# Patient Record
Sex: Male | Born: 1980 | Hispanic: Yes | Marital: Single | Smoking: Never smoker
Health system: Southern US, Community
[De-identification: ages and names within clinical notes are randomized; demographics above are authoritative.]

## PROBLEM LIST (undated history)

## (undated) DIAGNOSIS — R569 Unspecified convulsions: Secondary | ICD-10-CM

## (undated) HISTORY — DX: Unspecified convulsions: R56.9

---

## 2016-11-05 DIAGNOSIS — L603 Nail dystrophy: Secondary | ICD-10-CM | POA: Diagnosis not present

## 2016-11-05 DIAGNOSIS — K429 Umbilical hernia without obstruction or gangrene: Secondary | ICD-10-CM | POA: Diagnosis not present

## 2016-12-14 ENCOUNTER — Emergency Department (HOSPITAL_COMMUNITY)
Admission: EM | Admit: 2016-12-14 | Discharge: 2016-12-14 | Disposition: A | Discharge status: Home / Self Care | Payer: BLUE CROSS/BLUE SHIELD | Attending: Emergency Medicine | Admitting: Emergency Medicine

## 2016-12-14 ENCOUNTER — Emergency Department (HOSPITAL_COMMUNITY): Payer: BLUE CROSS/BLUE SHIELD

## 2016-12-14 ENCOUNTER — Encounter (HOSPITAL_COMMUNITY): Payer: Self-pay

## 2016-12-14 DIAGNOSIS — W1830XA Fall on same level, unspecified, initial encounter: Secondary | ICD-10-CM | POA: Diagnosis not present

## 2016-12-14 DIAGNOSIS — S01512A Laceration without foreign body of oral cavity, initial encounter: Secondary | ICD-10-CM | POA: Diagnosis not present

## 2016-12-14 DIAGNOSIS — R569 Unspecified convulsions: Secondary | ICD-10-CM | POA: Diagnosis not present

## 2016-12-14 DIAGNOSIS — S0990XA Unspecified injury of head, initial encounter: Secondary | ICD-10-CM | POA: Diagnosis present

## 2016-12-14 DIAGNOSIS — Y990 Civilian activity done for income or pay: Secondary | ICD-10-CM | POA: Diagnosis not present

## 2016-12-14 DIAGNOSIS — G40909 Epilepsy, unspecified, not intractable, without status epilepticus: Secondary | ICD-10-CM | POA: Diagnosis not present

## 2016-12-14 DIAGNOSIS — S0003XA Contusion of scalp, initial encounter: Secondary | ICD-10-CM | POA: Diagnosis not present

## 2016-12-14 DIAGNOSIS — Y929 Unspecified place or not applicable: Secondary | ICD-10-CM | POA: Insufficient documentation

## 2016-12-14 DIAGNOSIS — Y939 Activity, unspecified: Secondary | ICD-10-CM | POA: Insufficient documentation

## 2016-12-14 LAB — CBC WITH DIFFERENTIAL/PLATELET
Basophils Absolute: 0 10*3/uL (ref 0.0–0.1)
Basophils Relative: 0 %
EOS PCT: 0 %
Eosinophils Absolute: 0 10*3/uL (ref 0.0–0.7)
HEMATOCRIT: 45.9 % (ref 39.0–52.0)
Hemoglobin: 16.2 g/dL (ref 13.0–17.0)
LYMPHS ABS: 1.5 10*3/uL (ref 0.7–4.0)
LYMPHS PCT: 10 %
MCH: 31.8 pg (ref 26.0–34.0)
MCHC: 35.3 g/dL (ref 30.0–36.0)
MCV: 90.2 fL (ref 78.0–100.0)
MONO ABS: 0.7 10*3/uL (ref 0.1–1.0)
MONOS PCT: 5 %
NEUTROS ABS: 12.7 10*3/uL — AB (ref 1.7–7.7)
Neutrophils Relative %: 85 %
PLATELETS: 183 10*3/uL (ref 150–400)
RBC: 5.09 MIL/uL (ref 4.22–5.81)
RDW: 12.5 % (ref 11.5–15.5)
WBC: 14.9 10*3/uL — ABNORMAL HIGH (ref 4.0–10.5)

## 2016-12-14 LAB — COMPREHENSIVE METABOLIC PANEL
ALBUMIN: 4.8 g/dL (ref 3.5–5.0)
ALT: 42 U/L (ref 17–63)
ANION GAP: 6 (ref 5–15)
AST: 41 U/L (ref 15–41)
Alkaline Phosphatase: 68 U/L (ref 38–126)
BILIRUBIN TOTAL: 0.9 mg/dL (ref 0.3–1.2)
BUN: 15 mg/dL (ref 6–20)
CHLORIDE: 107 mmol/L (ref 101–111)
CO2: 25 mmol/L (ref 22–32)
Calcium: 8.7 mg/dL — ABNORMAL LOW (ref 8.9–10.3)
Creatinine, Ser: 0.92 mg/dL (ref 0.61–1.24)
GFR calc Af Amer: >60 mL/min (ref >60)
GFR calc non Af Amer: >60 mL/min (ref >60)
GLUCOSE: 102 mg/dL — AB (ref 65–99)
POTASSIUM: 3.2 mmol/L — AB (ref 3.5–5.1)
SODIUM: 138 mmol/L (ref 135–145)
Total Protein: 7.6 g/dL (ref 6.5–8.1)

## 2016-12-14 LAB — MAGNESIUM: MAGNESIUM: 2.2 mg/dL (ref 1.7–2.4)

## 2016-12-14 LAB — RAPID URINE DRUG SCREEN, HOSP PERFORMED
AMPHETAMINES: NOT DETECTED
BARBITURATES: NOT DETECTED
BENZODIAZEPINES: NOT DETECTED
Cocaine: NOT DETECTED
Opiates: NOT DETECTED
Tetrahydrocannabinol: POSITIVE — AB

## 2016-12-14 LAB — CK: CK TOTAL: 2207 U/L — AB (ref 49–397)

## 2016-12-14 MED ORDER — SODIUM CHLORIDE 0.9 % IV BOLUS (SEPSIS)
1000.0000 mL | Freq: Once | INTRAVENOUS | Status: AC
  Administered 2016-12-14: 1000 mL via INTRAVENOUS

## 2016-12-14 MED ORDER — IBUPROFEN 200 MG PO TABS
600.0000 mg | ORAL_TABLET | Freq: Once | ORAL | Status: AC
  Administered 2016-12-14: 600 mg via ORAL
  Filled 2016-12-14: qty 3

## 2016-12-14 MED ORDER — LEVETIRACETAM 500 MG PO TABS: 500.0000 mg | ORAL_TABLET | Freq: Two times a day (BID) | ORAL | 0 refills | Status: DC

## 2016-12-14 MED ORDER — LEVETIRACETAM 500 MG/5ML IV SOLN
1000.0000 mg | Freq: Once | INTRAVENOUS | Status: AC
  Administered 2016-12-14: 1000 mg via INTRAVENOUS
  Filled 2016-12-14: qty 10

## 2016-12-14 MED ORDER — LIDOCAINE HCL 2 % IJ SOLN
20.0000 mL | Freq: Once | INTRAMUSCULAR | Status: AC
  Administered 2016-12-14: 400 mg via INTRADERMAL
  Filled 2016-12-14: qty 20
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
## 2016-12-14 NOTE — ED Notes (Signed)
RN DRAWING LABS 

## 2016-12-14 NOTE — Discharge Instructions (Signed)
Take Keppra twice a day Please follow up with neurology

## 2016-12-19 ENCOUNTER — Ambulatory Visit (INDEPENDENT_AMBULATORY_CARE_PROVIDER_SITE_OTHER): Payer: BLUE CROSS/BLUE SHIELD | Admitting: Neurology

## 2016-12-19 ENCOUNTER — Encounter: Payer: Self-pay | Admitting: Neurology

## 2016-12-19 VITALS — BP 127/75 | HR 84 | Ht 69.0 in | Wt 189.0 lb

## 2016-12-19 DIAGNOSIS — G40009 Localization-related (focal) (partial) idiopathic epilepsy and epileptic syndromes with seizures of localized onset, not intractable, without status epilepticus: Secondary | ICD-10-CM | POA: Diagnosis not present

## 2016-12-19 MED ORDER — LEVETIRACETAM 500 MG PO TABS
500.0000 mg | ORAL_TABLET | Freq: Two times a day (BID) | ORAL | 11 refills | Status: DC
Start: 2016-12-19 — End: 2017-02-28
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
## 2016-12-19 NOTE — Patient Instructions (Signed)
I had a long discussion with the patient and his wife regarding his 2 episodes of brief altered consciousness with weakness seizure activity likely representing complex partial seizures. I agree with continuing Keppra 500 twice daily. Check MRI scan of the brain with and without contrast as well as EEG. I counseled the patient to avoid sleep deprivation and stimulants like alcohol and street drugs. He was advised not to drive for 6 months as per [facility]. He was also advised to stay away from working on heights or climbing ladders. He will return for follow-up in 2 months or call earlier if necessary  Epilepsy Epilepsy is when a person keeps having seizures. A seizure is unusual activity in the brain. A seizure can change how you think or behave, and it can make it hard to be aware of what is happening. This condition can cause problems, such as:  Falls, accidents, and injury.  Depression.  Poor memory.  Sudden unexplained death in epilepsy (SUDEP). This is rare. Its cause is not known. Most people with epilepsy lead normal lives. Follow these instructions at home: Medicines    Take medicines only as told by your doctor.  Avoid anything that may keep your medicine from working, such as alcohol. Activity   Get enough rest. Lack of sleep can make seizures more likely to occur.  Follow your doctor's advice about driving, swimming, and doing anything else that would be dangerous if you had a seizure. Teaching others  Teach friends and family what to do if you have a seizure. They should:  Lay you on the ground to prevent a fall.  Cushion your head and body.  Loosen any tight clothing around your neck.  Turn you on your side.  Stay with you until you are better.  Not hold you down.  Not put anything in your mouth.  Know whether or not you need emergency care. General instructions   Avoid anything that causes you to have seizures.  Keep a seizure diary. Write down  what you remember about each seizure, and especially what might have caused it.  Keep all follow-up visits as told by your doctor. This is important. Contact a doctor if:  You have a change in your seizure pattern.  You get an infection or start to feel sick. You may have more seizures when you are sick. Get help right away if:  A seizure does not stop after 5 minutes.  You have more than one seizure in a row, and you do not have enough time between the seizures to feel better.  A seizure makes it harder to breathe.  A seizure is different from other seizures you have had.  A seizure makes you unable to speak or use a part of your body.  You did not wake up right after a seizure. This information is not intended to replace advice given to you by your health care provider. Make sure you discuss any questions you have with your health care provider. Document Released: 06/10/2009 Document Revised: 03/19/2016 Document Reviewed: 02/21/2016 Elsevier Interactive Patient Education  2017 [employer]. .

## 2016-12-26 ENCOUNTER — Other Ambulatory Visit: Payer: BLUE CROSS/BLUE SHIELD

## 2016-12-26 ENCOUNTER — Ambulatory Visit (INDEPENDENT_AMBULATORY_CARE_PROVIDER_SITE_OTHER): Payer: BLUE CROSS/BLUE SHIELD

## 2016-12-26 DIAGNOSIS — G40009 Localization-related (focal) (partial) idiopathic epilepsy and epileptic syndromes with seizures of localized onset, not intractable, without status epilepticus: Secondary | ICD-10-CM | POA: Diagnosis not present

## 2016-12-26 MED ORDER — GADOPENTETATE DIMEGLUMINE 469.01 MG/ML IV SOLN: 19.0000 mL | Freq: Once | INTRAVENOUS | Status: DC | PRN

## 2016-12-31 ENCOUNTER — Other Ambulatory Visit: Payer: BLUE CROSS/BLUE SHIELD

## 2017-01-01 ENCOUNTER — Telehealth: Payer: Self-pay

## 2017-01-01 ENCOUNTER — Ambulatory Visit (INDEPENDENT_AMBULATORY_CARE_PROVIDER_SITE_OTHER): Payer: BLUE CROSS/BLUE SHIELD | Admitting: Neurology

## 2017-01-01 DIAGNOSIS — G40009 Localization-related (focal) (partial) idiopathic epilepsy and epileptic syndromes with seizures of localized onset, not intractable, without status epilepticus: Secondary | ICD-10-CM | POA: Diagnosis not present

## 2017-01-01 NOTE — Telephone Encounter (Signed)
I called and left a message on patient's answering machine about MRI scan of the brain showing no worrisome findings. She was advised to call back or keep her follow-up appointment with me

## 2017-01-01 NOTE — Telephone Encounter (Signed)
Receive a message from front desk Marisue IvanLiz that pt wanted results of MRI. Pt was here for EEG. Rn stated MRI results are in. Dr. Pearlean BrownieSethi will review MRI and give results if abnormal. IF normal the nurse will call.

## 2017-01-08 NOTE — Telephone Encounter (Signed)
Rn call patient that his EEG was normal. Pt verbalized understanding. 

## 2017-01-08 NOTE — Telephone Encounter (Signed)
Rn call patient that the MRI scan of brain showed no significant abnormalities. PT verbalized understanding.

## 2017-02-04 DIAGNOSIS — Z0000 Encounter for general adult medical examination without abnormal findings: Secondary | ICD-10-CM | POA: Diagnosis not present

## 2017-02-04 DIAGNOSIS — Z23 Encounter for immunization: Secondary | ICD-10-CM | POA: Diagnosis not present

## 2017-02-28 ENCOUNTER — Encounter: Payer: Self-pay | Admitting: Neurology

## 2017-02-28 ENCOUNTER — Ambulatory Visit (INDEPENDENT_AMBULATORY_CARE_PROVIDER_SITE_OTHER): Payer: BLUE CROSS/BLUE SHIELD | Admitting: Neurology

## 2017-02-28 ENCOUNTER — Encounter (INDEPENDENT_AMBULATORY_CARE_PROVIDER_SITE_OTHER): Payer: Self-pay

## 2017-02-28 VITALS — BP 120/75 | HR 74 | Ht 69.0 in | Wt 189.6 lb

## 2017-02-28 DIAGNOSIS — G40909 Epilepsy, unspecified, not intractable, without status epilepticus: Secondary | ICD-10-CM | POA: Diagnosis not present

## 2017-02-28 MED ORDER — LEVETIRACETAM 500 MG PO TABS: 500.0000 mg | ORAL_TABLET | Freq: Two times a day (BID) | ORAL | 11 refills | Status: DC
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
## 2017-02-28 NOTE — Patient Instructions (Signed)
I had a long discussion with the patient and his lady friend regarding his seizure, need for compliance with Keppra and need to avoid seizure provoking stimuli and discuss results of MRI scan and EEG and answered questions. Continue Keppra 500 mg twice daily and he was given a refill. The patient may start driving next month and she'll be seizure free more than 4 months that time. He will return for follow-up in 6 months with minerals practitioner call earlier if necessary

## 2017-05-02 ENCOUNTER — Ambulatory Visit: Payer: BLUE CROSS/BLUE SHIELD | Admitting: Family Medicine

## 2017-05-03 ENCOUNTER — Encounter: Payer: Self-pay | Admitting: Family Medicine

## 2017-05-03 ENCOUNTER — Ambulatory Visit (INDEPENDENT_AMBULATORY_CARE_PROVIDER_SITE_OTHER): Payer: BLUE CROSS/BLUE SHIELD | Admitting: Family Medicine

## 2017-05-03 DIAGNOSIS — M25561 Pain in right knee: Secondary | ICD-10-CM | POA: Diagnosis not present

## 2017-05-03 MED ORDER — DICLOFENAC SODIUM 75 MG PO TBEC: 75.0000 mg | DELAYED_RELEASE_TABLET | Freq: Two times a day (BID) | ORAL | 1 refills | Status: DC

## 2017-05-03 NOTE — Patient Instructions (Signed)
You have synovitis of your knee due to overuse, likely early arthritis. Your ligament, meniscus testing is normal. Take diclofenac  twice a day with food for pain and inflammation - take regularly for 10 days then as needed. Icing 15 minutes at a time 3-4 times a day at least. Compression sleeve or ACE wrap to help with swelling. Elevate above your heart as much as possible. Straight leg raises, knee extensions 3 sets of 10 once a day. I'd expect this to improve over 2-3 weeks. If you're struggling and want to try aspiration/injection or just injection give me a call. Otherwise follow up with me as needed.

## 2017-05-06 ENCOUNTER — Ambulatory Visit: Payer: BLUE CROSS/BLUE SHIELD | Admitting: Family Medicine

## 2017-05-06 DIAGNOSIS — M25561 Pain in right knee: Secondary | ICD-10-CM | POA: Insufficient documentation

## 2017-05-06 NOTE — Progress Notes (Signed)
PCP: [facility]  Subjective:   HPI: Patient is a 36 y.o. male here for right knee pain.  Patient reports about 1 week ago he started to get pain and swelling in right knee. 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 He reports falling onto this knee last summer when playing soccer and it swelled up on him. Has been icing at night but not taking any medicines for this. Has medial pain that feels like a nail digging into the area at times 3/10 level. No catching, locking, giving out. No skin changes, numbness.  Past Medical History:  Diagnosis Date  . Seizures (HCC)     Current Outpatient Prescriptions on File Prior to Visit  Medication Sig Dispense Refill  . Biotin 1 MG CAPS Take 1,000 mg by mouth daily.    Marland Kitchen. levETIRAcetam (KEPPRA) 500 MG tablet Take 1 tablet (500 mg total) by mouth 2 (two) times daily. 60 tablet 11  . Melatonin 5 MG TABS Take 5 mg by mouth at bedtime.     Current Facility-Administered Medications on File Prior to Visit  Medication Dose Route Frequency Provider Last Rate Last Dose  . gadopentetate dimeglumine (MAGNEVIST) injection 19 mL  19 mL Intravenous Once PRN Micki RileySethi, Pramod S, MD        No past surgical history on file.  No Known Allergies  Social History   Social History  . Marital status: Single    Spouse name: N/A  . Number of children: N/A  . Years of education: N/A   Occupational History  . Not on file.   Social History Main Topics  . Smoking status: Never Smoker  . Smokeless tobacco: Never Used  . Alcohol use Yes     Comment: social  . Drug use: No  . Sexual activity: Not on file   Other Topics Concern  . Not on file   Social History Narrative  . No narrative on file    No family history on file.  BP 131/80   Pulse 74   Ht 5\' 8"  (1.727 m)   Wt 185 lb (83.9 kg)   BMI 28.13 kg/m   Review of Systems: See HPI above.      Objective:  Physical Exam:  Gen: NAD, comfortable in exam room  Right knee: Mod effusion.  No bruising, other deformity. Minimal tenderness medial joint line.  No other tenderness. FROM. Negative ant/post drawers. Negative valgus/varus testing. Negative lachmanns. Negative mcmurrays, apleys, patellar apprehension. NV intact distally.  Left knee: No gross deformity, ecchymoses, swelling. No TTP. FROM. Negative ant/post drawers. Negative valgus/varus testing. NV intact distally.   Assessment & Plan:  1. Right knee pain and effusion - confirmed effusion and synovitis on today's MSK u/s.  His exam is otherwise reassuring with negative ligamentous and meniscal testing.  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  Start with diclofenac twice a day, icing, compression, elevation.  Shown home exercises to do daily.  Consider aspiration/injection if not improving as expected over next 2-3 weeks.  F/u prn if doing well.

## 2017-05-06 NOTE — Assessment & Plan Note (Signed)
confirmed effusion and synovitis on today's MSK u/s.  His exam is otherwise reassuring with negative ligamentous and meniscal testing.  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  Start with diclofenac twice a day, icing, compression, elevation.  Shown home exercises to do daily.  Consider aspiration/injection if not improving as expected over next 2-3 weeks.  F/u prn if doing well.
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
## 2017-09-02 ENCOUNTER — Encounter: Payer: Self-pay | Admitting: Nurse Practitioner

## 2017-09-02 ENCOUNTER — Encounter (INDEPENDENT_AMBULATORY_CARE_PROVIDER_SITE_OTHER): Payer: Self-pay

## 2017-09-02 ENCOUNTER — Ambulatory Visit: Payer: BLUE CROSS/BLUE SHIELD | Admitting: Nurse Practitioner

## 2017-09-02 DIAGNOSIS — G40909 Epilepsy, unspecified, not intractable, without status epilepticus: Secondary | ICD-10-CM | POA: Diagnosis not present

## 2017-09-02 MED ORDER — LEVETIRACETAM 500 MG PO TABS: 500.0000 mg | ORAL_TABLET | Freq: Two times a day (BID) | ORAL | 11 refills | Status: DC

## 2017-09-02 NOTE — Patient Instructions (Signed)
Restart Keppra 500mg  twice daily Call for any seizure activity Follpw up 6 months Please remember, common seizure triggers are: Sleep deprivation, dehydration, overheating, stress, hypoglycemia or skipping meals, certain medications or excessive alcohol use, especially stopping alcohol abruptly if you have had heavy alcohol use before (aka alcohol withdrawal seizure). If you have a prolonged seizure over 2-5 minutes or back to back seizures, call or have someone call 911 or take you to the nearest emergency room. You cannot drive a car or operate any other machinery or vehicle within 6 months of a seizure. Please do not swim alone  for safety.  Take your medicine for seizure prevention regularly and do not skip doses or stop medication abruptly and tone are told to do so by your healthcare provider. Try to get a refill on your antiepileptic medication ahead of time, so you are not at risk of running out. If you run out of the seizure medication and do not have a refill at hand she may run into medication withdrawal seizures. Avoid taking Wellbutrin, narcotic pain medications and tramadol, as they can lower seizure threshold.

## 2017-09-02 NOTE — Progress Notes (Signed)
Keppra Rx successfully faxed to [facility], [address]

## 2018-03-06 IMAGING — CT CT HEAD W/O CM
3 of 4 series · 15 of 47 positions shown, 18 images · non-contrast
Comparison: None.

CLINICAL DATA: Possible seizure today.

EXAM:
CT HEAD WITHOUT CONTRAST
TECHNIQUE: Contiguous axial images were obtained from the base of the skull
through the vertex without intravenous contrast.

[Series 2: head w/o · axial · non-contrast · 0.40mm/px · z∈[-151,-26]mm · 9 of 31 slices shown, 12 images]
[im 3/31  brain]
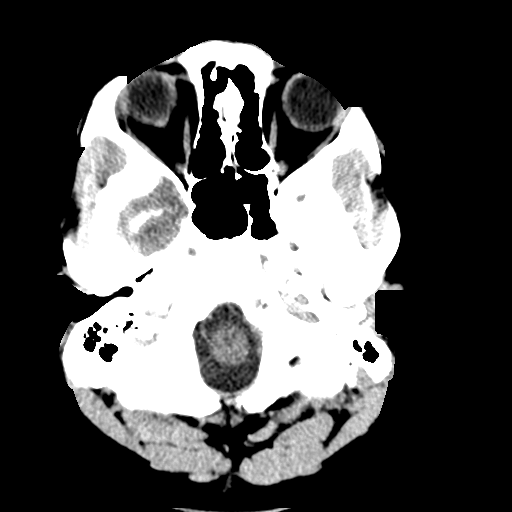
[im 3/31  bone]
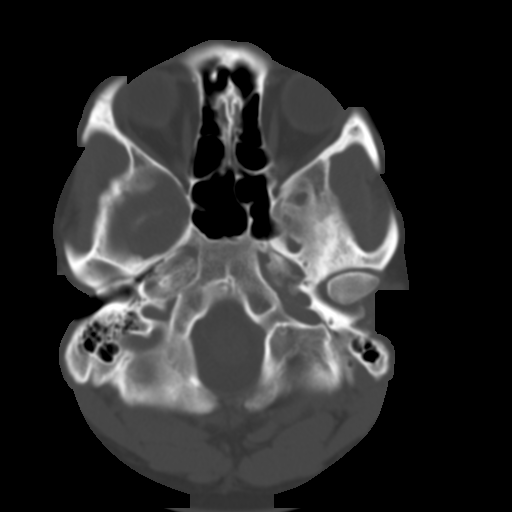
[im 7/31  brain]
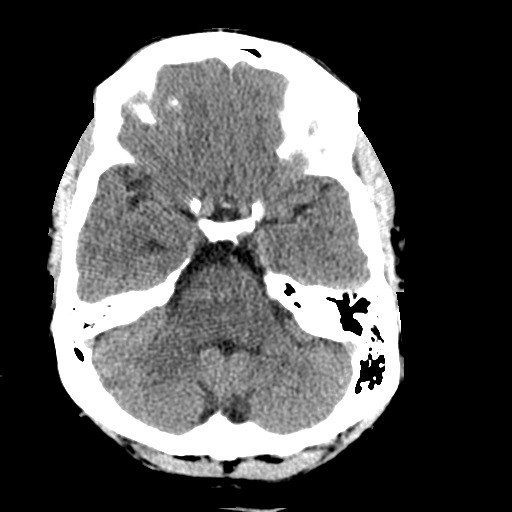
[im 9/31  brain]
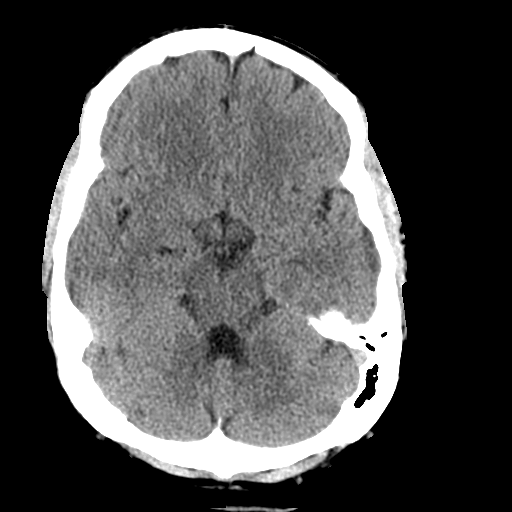
[im 13/31  brain]
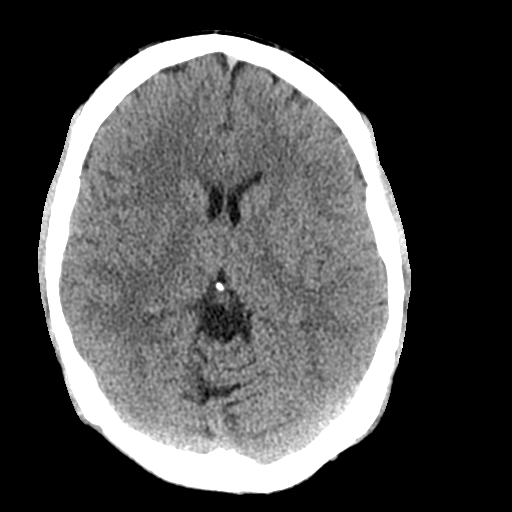
[im 16/31  brain]
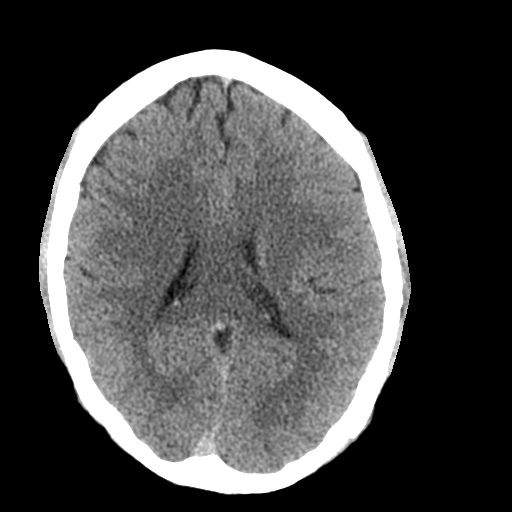
[im 16/31  bone]
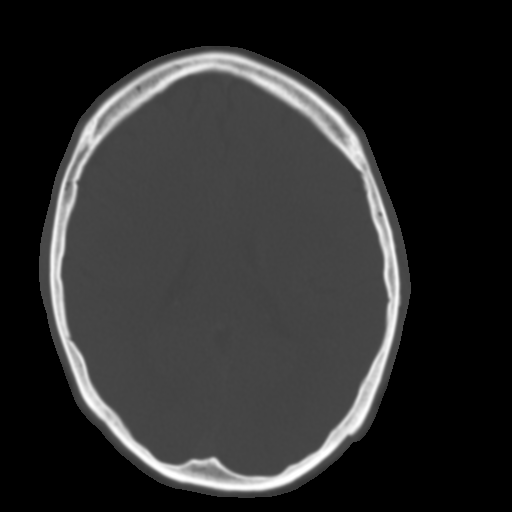
[im 18/31  brain]
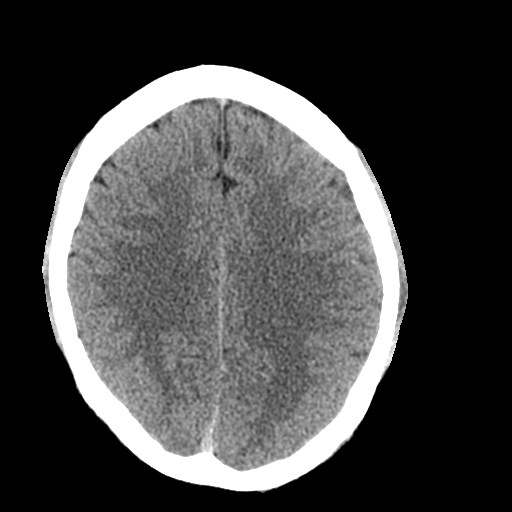
[im 22/31  brain]
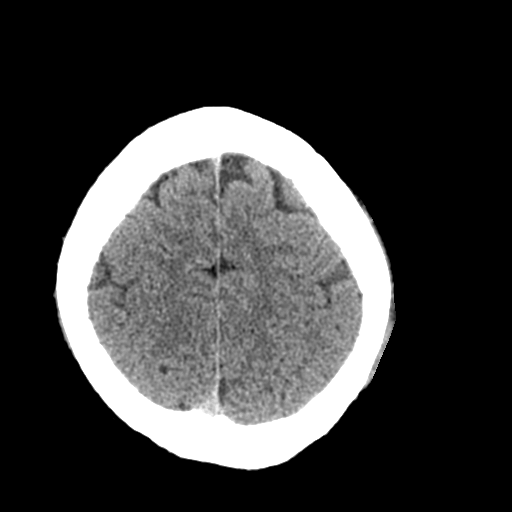
[im 24/31  brain]
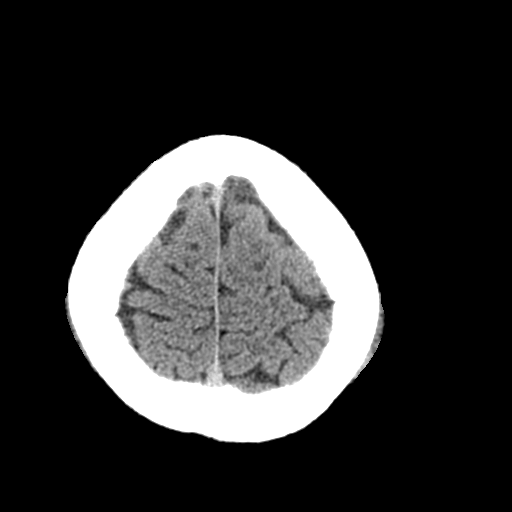
[im 28/31  brain]
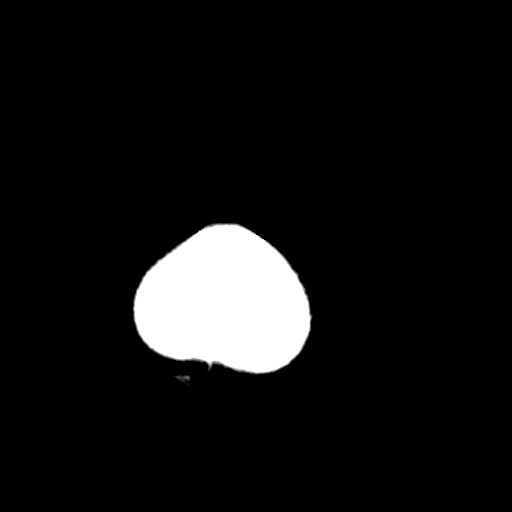
[im 28/31  bone]
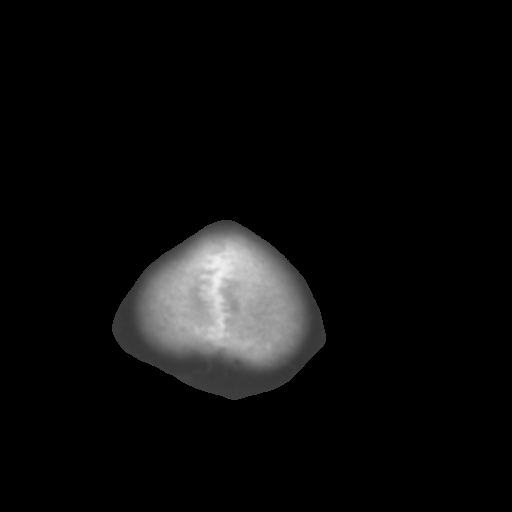

[Series 5: coronal · coronal · 0.29mm/px · 3 of 67 slices shown]
[im 23/67  brain]
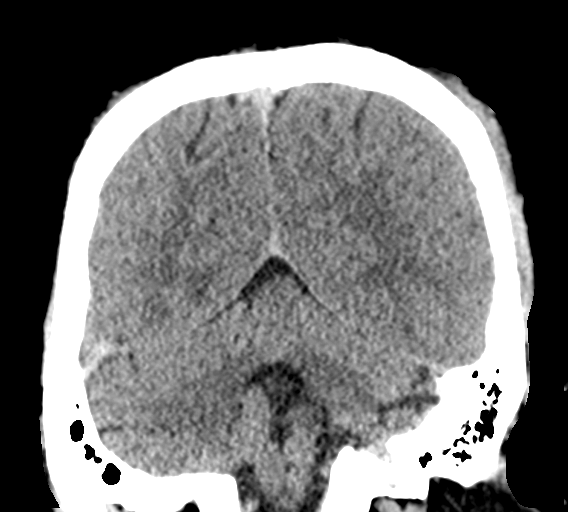
[im 30/67  brain]
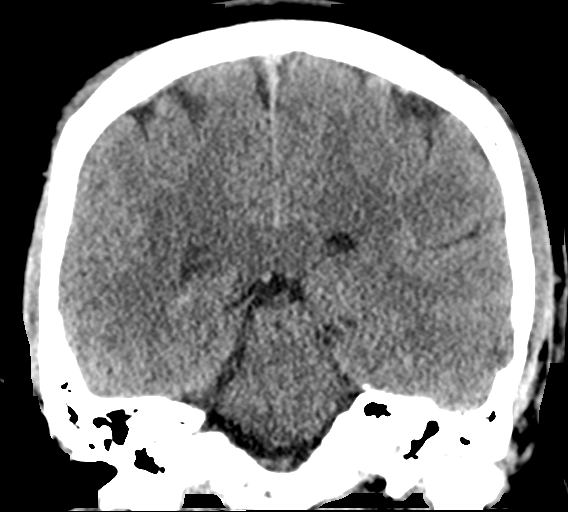
[im 37/67  brain]
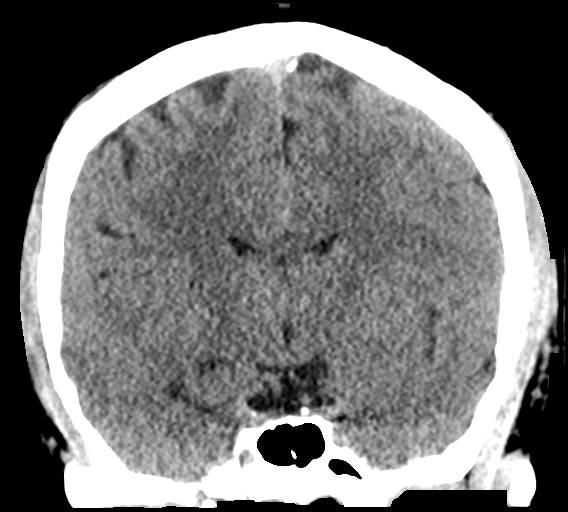

[Series 6: sagittal · sagittal · 0.29mm/px · 3 of 54 slices shown]
[im 18/54  brain]
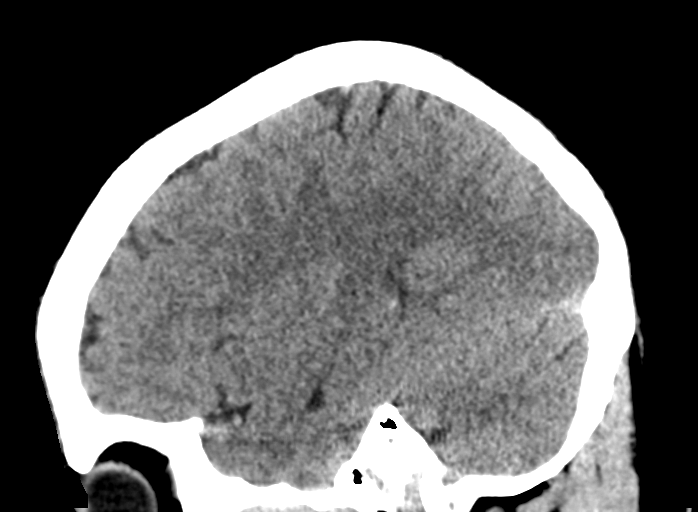
[im 27/54  brain]
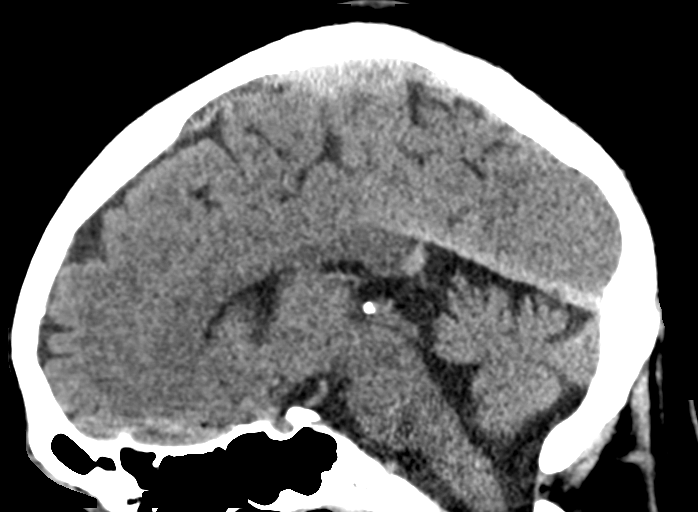
[im 36/54  brain]
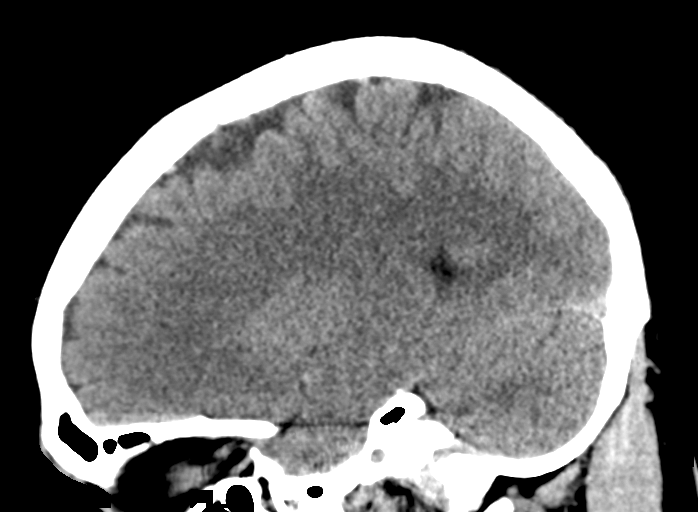

[15 of 47 positions shown; findings below may reference images not displayed]

FINDINGS: Brain: Appears normal without hemorrhage, infarct, mass lesion, mass
effect, midline shift or abnormal extra-axial fluid collection. No
hydrocephalus or pneumocephalus.

Vascular: Negative.

Skull: Intact.

Sinuses/Orbits: Negative.

Other: Scalp contusion near the vertex posteriorly on the left is
noted.
IMPRESSION: Scalp contusion near the vertex on the left without underlying
fracture. The brain appears normal.

## 2018-10-10 ENCOUNTER — Telehealth: Payer: Self-pay | Admitting: Nurse Practitioner

## 2018-10-10 MED ORDER — LEVETIRACETAM 500 MG PO TABS: 500.0000 mg | ORAL_TABLET | Freq: Two times a day (BID) | ORAL | 0 refills | Status: DC

## 2018-10-10 NOTE — Addendum Note (Signed)
Addended by: Colen Darling C on: 10/10/2018 11:01 AM   Modules accepted: Orders

## 2018-10-10 NOTE — Telephone Encounter (Signed)
Pt has scheduled annual f/u he is asking for a refill on his levETIRAcetam (KEPPRA) 500 MG tablet [employer] [phone]

## 2018-10-10 NOTE — Telephone Encounter (Signed)
Levetiracetam refilled x 1 month. Patient has FU on 10/15/18, will get additional refills after FU.

## 2018-10-15 ENCOUNTER — Ambulatory Visit: Payer: BLUE CROSS/BLUE SHIELD | Admitting: Adult Health

## 2018-10-15 ENCOUNTER — Encounter: Payer: Self-pay | Admitting: Adult Health

## 2018-10-15 VITALS — BP 112/71 | HR 76 | Ht 69.0 in | Wt 196.0 lb

## 2018-10-15 DIAGNOSIS — G40909 Epilepsy, unspecified, not intractable, without status epilepticus: Secondary | ICD-10-CM

## 2018-10-15 MED ORDER — LEVETIRACETAM 500 MG PO TABS: 500.0000 mg | ORAL_TABLET | Freq: Two times a day (BID) | ORAL | 3 refills | Status: DC

## 2018-10-15 NOTE — Progress Notes (Signed)
[facility]  PATIENT: Tanner [name] DOB: Aug 04, 1981   REASON FOR VISIT: Follow-up for seizure disorder HISTORY FROM: Patient  HPI:  Tanner [name] is a 38 y.o. male who is here today for follow-up of seizures and medication management.  Initial seizure activity in 2018 and was placed on Keppra at that time.  MRI head on 12/2016 showed tiny nonspecific white matter hyperintensities but otherwise no other structural lesion.  EEG also performed in 12/2016 which was normal.  He is being seen today for one-year follow-up.  He continues on Keppra without reported side effects.  He denies any recurrent seizure activity.  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  He returns today for evaluation.     REVIEW OF SYSTEMS: Full 14 system review of systems performed and notable only for those listed, all others are neg:  Constitutional: neg  Cardiovascular: neg Ear/Nose/Throat: neg  Skin: neg Eyes: neg Respiratory: neg Gastroitestinal: neg  Hematology/Lymphatic: neg  Endocrine: neg Musculoskeletal:neg Allergy/Immunology: neg Neurological: neg Psychiatric: neg Sleep : neg   ALLERGIES: No Known Allergies  HOME MEDICATIONS: Outpatient Medications Prior to Visit  Medication Sig Dispense Refill  . levETIRAcetam (KEPPRA) 500 MG tablet Take 1 tablet (500 mg total) by mouth 2 (two) times daily. 60 tablet 0  . diphenhydramine-acetaminophen (TYLENOL PM) 25-500 MG TABS tablet Take by mouth.     Facility-Administered Medications Prior to Visit  Medication Dose Route Frequency Provider Last Rate Last Dose  . gadopentetate dimeglumine (MAGNEVIST) injection 19 mL  19 mL Intravenous Once PRN Micki Riley, MD        PAST MEDICAL HISTORY: Past Medical History:  Diagnosis Date  . Seizures (HCC)     PAST SURGICAL HISTORY: No past surgical history on file.  FAMILY HISTORY: No family history on file.  SOCIAL  HISTORY: Social History   Socioeconomic History  . Marital status: Single    Spouse name: Not on file  . Number of children: Not on file  . Years of education: Not on file  . Highest education level: Not on file  Occupational History  . Not on file  Social Needs  . Financial resource strain: Not on file  . Food insecurity:    Worry: Not on file    Inability: Not on file  . Transportation needs:    Medical: Not on file    Non-medical: Not on file  Tobacco Use  . Smoking status: Never Smoker  . Smokeless tobacco: Never Used  Substance and Sexual Activity  . Alcohol use: Yes    Comment: social  . Drug use: No  . Sexual activity: Not on file  Lifestyle  . Physical activity:    Days per week: Not on file    Minutes per session: Not on file  . Stress: Not on file  Relationships  . Social connections:    Talks on phone: Not on file    Gets together: Not on file    Attends religious service: Not on file    Active member of club or organization: Not on file    Attends meetings of clubs or organizations: Not on file    Relationship status: Not on file  . Intimate partner violence:    Fear of current or ex partner: Not on file    Emotionally abused: Not on file    Physically abused: Not on file    Forced sexual activity: Not on file  Other  Topics Concern  . Not on file  Social History Narrative  . Not on file     PHYSICAL EXAM  Vitals:   10/15/18 1346  BP: 112/71  Pulse: 76  Weight: 196 lb (88.9 kg)  Height: 5\' 9"  (1.753 m)   Body mass index is 28.94 kg/m.  Generalized: Well developed, pleasant middle-aged Hispanic male, in no acute distress  Head: normocephalic and atraumatic,.  Musculoskeletal: No deformity   Neurological examination   Mentation: Alert oriented to time, place, history taking. Attention span and concentration appropriate. Recent and remote memory intact.  Follows all commands speech and language fluent.   Cranial nerve II-XII: Pupils were  equal round reactive to light extraocular movements were full, visual field were full on confrontational test. Facial sensation and strength were normal. hearing was intact to finger rubbing bilaterally. Uvula tongue midline. head turning and shoulder shrug were normal and symmetric.Tongue protrusion into cheek strength was normal. Motor: normal bulk and tone, full strength in the BUE, BLE, fine finger movements normal, no pronator drift. No focal weakness Sensory: normal and symmetric to light touch,  Coordination: finger-nose-finger, heel-to-shin bilaterally, no dysmetria Reflexes: Brachioradialis 2/2, biceps 2/2, triceps 2/2, patellar 2/2, Achilles 2/2, plantar responses were flexor bilaterally. Gait and Station: Rising up from seated position without assistance, normal stance,  moderate stride, good arm swing, smooth turning, able to perform tiptoe, and heel walking without difficulty. Tandem gait is steady  DIAGNOSTIC DATA (LABS, IMAGING, TESTING) - I reviewed patient records, labs, notes, testing and imaging myself where available.    ASSESSMENT AND PLAN    Tanner [name] is a 38 y.o. Hispanic male with history of seizure activity in 2018 and has been stable since that time with continuation of Keppra.   Continuation of Keppra 500 mg twice daily -refill sent to his pharmacy Continue to stay active and ensure compliance of ADD  Follow-up in 1 year or call earlier if needed  Greater than 50% of time during this 15 minute visit was spent on counseling,explanation of diagnosis of seizures, planning of further management, discussion with patient regarding continuation of AED and importance of ongoing compliance   George Hugh, AGNP-BC  [facility] [address] [place], [place] [address]  Phone [phone] Fax [phone] Note: This document was prepared with digital dictation and possible smart phrase technology. Any transcriptional  errors that result from this process are unintentional.

## 2018-10-15 NOTE — Patient Instructions (Addendum)
Your Plan:  Continue Keppra 500 mg twice daily for seizure prevention  Follow-up in 1 year or call earlier if needed with any seizure activity, questions, concerns or need a sooner follow-up appointment     Thank you for coming to see [place] at [facility]. I hope we have been able to provide you high quality care today.  You may receive a patient satisfaction survey over the next few weeks. We would appreciate your feedback and comments so that we may continue to improve ourselves and the health of our patients.

## 2018-10-17 NOTE — Progress Notes (Signed)
I agree with the above plan 

## 2019-10-07 ENCOUNTER — Telehealth: Payer: Self-pay | Admitting: Adult Health

## 2019-10-07 NOTE — Telephone Encounter (Signed)
I called patient and LVM to reschedule 2/24 appointment due to NP out of office. Requested patient call back to reschedule. 

## 2019-10-21 ENCOUNTER — Ambulatory Visit: Payer: Self-pay | Admitting: Adult Health

## 2019-11-02 ENCOUNTER — Ambulatory Visit: Payer: Self-pay | Admitting: Adult Health

## 2019-11-02 NOTE — Progress Notes (Deleted)
[facility]  PATIENT: Tanner [name] DOB: 1981-02-02   REASON FOR VISIT: Follow-up for seizure disorder HISTORY FROM: Patient  Chief complaint: No chief complaint on file.     HPI:  Tanner [name] is a 39 y.o. male who is here today, 11/02/2019, for follow-up of seizures and medication management.  Initial seizure activity in 2018 and was placed on Keppra at that time.  MRI head on 12/2016 showed tiny nonspecific white matter hyperintensities but otherwise no other structural lesion.  EEG also performed in 12/2016 which was normal.  He continues on Keppra 500 mg twice daily without reported side effects.  He denies any recurrent seizure activity.  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  No concerns at this time.     REVIEW OF SYSTEMS: Full 14 system review of systems performed and notable only for those listed, all others are neg:  Constitutional: neg  Cardiovascular: neg Ear/Nose/Throat: neg  Skin: neg Eyes: neg Respiratory: neg Gastroitestinal: neg  Hematology/Lymphatic: neg  Endocrine: neg Musculoskeletal:neg Allergy/Immunology: neg Neurological: neg Psychiatric: neg Sleep : neg   ALLERGIES: No Known Allergies  HOME MEDICATIONS: Outpatient Medications Prior to Visit  Medication Sig Dispense Refill  . levETIRAcetam (KEPPRA) 500 MG tablet Take 1 tablet (500 mg total) by mouth 2 (two) times daily. 120 tablet 3   Facility-Administered Medications Prior to Visit  Medication Dose Route Frequency Provider Last Rate Last Admin  . gadopentetate dimeglumine (MAGNEVIST) injection 19 mL  19 mL Intravenous Once PRN Garvin Fila, MD        PAST MEDICAL HISTORY: Past Medical History:  Diagnosis Date  . Seizures ([place])     PAST SURGICAL HISTORY: No past surgical history on file.  FAMILY HISTORY: No family history on file.  SOCIAL HISTORY: Social History   Socioeconomic History  .  Marital status: Single    Spouse name: Not on file  . Number of children: Not on file  . Years of education: Not on file  . Highest education level: Not on file  Occupational History  . Not on file  Tobacco Use  . Smoking status: Never Smoker  . Smokeless tobacco: Never Used  Substance and Sexual Activity  . Alcohol use: Yes    Comment: social  . Drug use: No  . Sexual activity: Not on file  Other Topics Concern  . Not on file  Social History Narrative  . Not on file   Social Determinants of Health   Financial Resource Strain:   . Difficulty of Paying Living Expenses: Not on file  Food Insecurity:   . Worried About Charity fundraiser in the Last Year: Not on file  . Ran Out of Food in the Last Year: Not on file  Transportation Needs:   . Lack of Transportation (Medical): Not on file  . Lack of Transportation (Non-Medical): Not on file  Physical Activity:   . Days of Exercise per Week: Not on file  . Minutes of Exercise per Session: Not on file  Stress:   . Feeling of Stress : Not on file  Social Connections:   . Frequency of Communication with Friends and Family: Not on file  . Frequency of Social Gatherings with Friends and Family: Not on file  . Attends Religious Services: Not on file  . Active Member of Clubs or Organizations: Not on file  . Attends Archivist Meetings: Not on file  . Marital Status:  Not on file  Intimate Partner Violence:   . Fear of Current or Ex-Partner: Not on file  . Emotionally Abused: Not on file  . Physically Abused: Not on file  . Sexually Abused: Not on file     PHYSICAL EXAM  There were no vitals filed for this visit. There is no height or weight on file to calculate BMI.  Generalized: Well developed, pleasant middle-aged Hispanic male, in no acute distress  Head: normocephalic and atraumatic,.  Musculoskeletal: No deformity   Neurological examination   Mentation: Alert oriented to time, place, history taking.  Attention span and concentration appropriate. Recent and remote memory intact.  Follows all commands speech and language fluent.   Cranial nerve II-XII: Pupils were equal round reactive to light extraocular movements were full, visual field were full on confrontational test. Facial sensation and strength were normal. hearing was intact to finger rubbing bilaterally. Uvula tongue midline. head turning and shoulder shrug were normal and symmetric.Tongue protrusion into cheek strength was normal. Motor: normal bulk and tone, full strength in the BUE, BLE, fine finger movements normal, no pronator drift. No focal weakness Sensory: normal and symmetric to light touch,  Coordination: finger-nose-finger, heel-to-shin bilaterally, no dysmetria Reflexes: Brachioradialis 2/2, biceps 2/2, triceps 2/2, patellar 2/2, Achilles 2/2, plantar responses were flexor bilaterally. Gait and Station: Rising up from seated position without assistance, normal stance,  moderate stride, good arm swing, smooth turning, able to perform tiptoe, and heel walking without difficulty. Tandem gait is steady  DIAGNOSTIC DATA (LABS, IMAGING, TESTING) - I reviewed patient records, labs, notes, testing and imaging myself where available.    ASSESSMENT AND PLAN    Tanner [name] is a 39 y.o. Hispanic male with history of seizure activity in 2018 and has been stable since that time with continuation of Keppra.   Continuation of Keppra 500 mg twice daily -refill sent to his pharmacy Continue to stay active and ensure compliance of ADD  Follow-up in 1 year or call earlier if needed  Greater than 50% of time during this 15 minute visit was spent on counseling,explanation of diagnosis of seizures, planning of further management, discussion with patient regarding continuation of AED and importance of ongoing compliance   Tanner [name], AGNP-BC  [facility] [address] [place], [place]  [address]  Phone [phone] Fax [phone] Note: This document was prepared with digital dictation and possible smart phrase technology. Any transcriptional errors that result from this process are unintentional.

## 2019-11-12 NOTE — Progress Notes (Signed)
[facility]  PATIENT: Tanner [name] DOB: 04-18-1981   REASON FOR VISIT: Follow-up for seizure disorder HISTORY FROM: Patient  Chief complaint: Chief Complaint  Patient presents with  . Follow-up    [place] Rm. With daughter. doing wwell, would like to discuss lower dosing of current medication.      HPI:  Tanner [name] is a 39 y.o. male who is here today, 11/16/2019, for follow-up of seizures and medication management.  Initial seizure activity in 2018 and was placed on Keppra at that time.  MRI head on 12/2016 showed tiny nonspecific white matter hyperintensities but otherwise no other structural lesion or abnormality.  EEG in 12/2016 normal.  He continues on Keppra 500 mg twice daily without reported side effects.  He denies any recurrent seizure activity.  He does question possibility of lowering dosage as he has been stable without reoccurring seizures.  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  No concerns at this time.     REVIEW OF SYSTEMS: Full 14 system review of systems performed and notable only for those listed, all others are neg:  Constitutional: neg  Cardiovascular: neg Ear/Nose/Throat: neg  Skin: neg Eyes: neg Respiratory: neg Gastroitestinal: neg  Hematology/Lymphatic: neg  Endocrine: neg Musculoskeletal:neg Allergy/Immunology: neg Neurological: neg Psychiatric: neg Sleep : neg   ALLERGIES: No Known Allergies  HOME MEDICATIONS: Outpatient Medications Prior to Visit  Medication Sig Dispense Refill  . levETIRAcetam (KEPPRA) 500 MG tablet Take 1 tablet (500 mg total) by mouth 2 (two) times daily. 120 tablet 3   Facility-Administered Medications Prior to Visit  Medication Dose Route Frequency Provider Last Rate Last Admin  . gadopentetate dimeglumine (MAGNEVIST) injection 19 mL  19 mL Intravenous Once PRN Micki Riley, MD        PAST MEDICAL HISTORY: Past Medical History:  Diagnosis Date  .  Seizures (HCC)     PAST SURGICAL HISTORY: No past surgical history on file.  FAMILY HISTORY: No family history on file.  SOCIAL HISTORY: Social History   Socioeconomic History  . Marital status: Single    Spouse name: Not on file  . Number of children: Not on file  . Years of education: Not on file  . Highest education level: Not on file  Occupational History  . Not on file  Tobacco Use  . Smoking status: Never Smoker  . Smokeless tobacco: Never Used  Substance and Sexual Activity  . Alcohol use: Yes    Comment: social  . Drug use: No  . Sexual activity: Not on file  Other Topics Concern  . Not on file  Social History Narrative  . Not on file   Social Determinants of Health   Financial Resource Strain:   . Difficulty of Paying Living Expenses:   Food Insecurity:   . Worried About Programme researcher, broadcasting/film/video in the Last Year:   . Barista in the Last Year:   Transportation Needs:   . Freight forwarder (Medical):   Marland Kitchen Lack of Transportation (Non-Medical):   Physical Activity:   . Days of Exercise per Week:   . Minutes of Exercise per Session:   Stress:   . Feeling of Stress :   Social Connections:   . Frequency of Communication with Friends and Family:   . Frequency of Social Gatherings with Friends and Family:   . Attends Religious Services:   . Active Member of Clubs or Organizations:   . Attends Club  or Organization Meetings:   Marland Kitchen Marital Status:   Intimate Partner Violence:   . Fear of Current or Ex-Partner:   . Emotionally Abused:   Marland Kitchen Physically Abused:   . Sexually Abused:      PHYSICAL EXAM  Vitals:   11/16/19 0720  BP: 140/86  Pulse: 72  Temp: 97.7 F (36.5 C)  Weight: 197 lb (89.4 kg)  Height: 5\' 9"  (1.753 m)   Body mass index is 29.09 kg/m.  General: well developed, well nourished,  pleasant middle-age male, seated, in no evident distress Head: head normocephalic and atraumatic.   Neck: supple with no carotid or supraclavicular  bruits Cardiovascular: regular rate and rhythm, no murmurs Musculoskeletal: no deformity Skin:  no rash/petichiae Vascular:  Normal pulses all extremities   Neurologic Exam Mental Status: Awake and fully alert.   Normal speech and language.  Oriented to place and time. Recent and remote memory intact. Attention span, concentration and fund of knowledge appropriate. Mood and affect appropriate.  Cranial Nerves: Pupils equal, briskly reactive to light. Extraocular movements full without nystagmus. Visual fields full to confrontation. Hearing intact. Facial sensation intact. Face, tongue, palate moves normally and symmetrically.  Motor: Normal bulk and tone. Normal strength in all tested extremity muscles. Sensory.: intact to touch , pinprick , position and vibratory sensation.  Coordination: Rapid alternating movements normal in all extremities. Finger-to-nose and heel-to-shin performed accurately bilaterally. Gait and Station: Arises from chair without difficulty. Stance is normal. Gait demonstrates normal stride length and balance Reflexes: 1+ and symmetric. Toes downgoing.       ASSESSMENT AND PLAN    Tanner [name] is a 39 y.o. Hispanic male with history of seizure activity in 2018 consisting of loss of consciousness without reoccurring seizure activity with ongoing use of Keppra 500 mg twice daily.   Continuation of Keppra 500 mg twice daily -refill sent to his pharmacy.  Discussion regarding potentially lowering dosage to 250 mg twice daily or 500 mg XR daily but due to seizure type, he would have to refrain from driving for 6 months due to potential of recurrent seizure with decreasing dosage.  He prefers at this time to continue current dosage. Advised to call office with any seizure activity or concerns  Follow-up in 1 year or call earlier if needed  I spent 21 minutes of face-to-face and non-face-to-face time with patient.  This included previsit chart review, lab review,  study review, order entry, electronic health record documentation, patient education regarding ongoing use of AED for seizure prophylaxis    Frann Rider, AGNP-BC  [facility] [address] [place], [place] [address]  Phone [phone] Fax [phone] Note: This document was prepared with digital dictation and possible smart phrase technology. Any transcriptional errors that result from this process are unintentional.

## 2019-11-16 ENCOUNTER — Ambulatory Visit: Payer: Managed Care, Other (non HMO) | Admitting: Adult Health

## 2019-11-16 ENCOUNTER — Other Ambulatory Visit: Payer: Self-pay

## 2019-11-16 ENCOUNTER — Encounter: Payer: Self-pay | Admitting: Adult Health

## 2019-11-16 VITALS — BP 140/86 | HR 72 | Temp 97.7°F | Ht 69.0 in | Wt 197.0 lb

## 2019-11-16 DIAGNOSIS — G40909 Epilepsy, unspecified, not intractable, without status epilepticus: Secondary | ICD-10-CM

## 2019-11-16 MED ORDER — LEVETIRACETAM 500 MG PO TABS: 500.0000 mg | ORAL_TABLET | Freq: Two times a day (BID) | ORAL | 3 refills | Status: DC

## 2019-11-16 NOTE — Patient Instructions (Signed)
Your Plan:  Continue keppra 500mg  twice daily - refill placed        Thank you for coming to see at [facility]. I hope we have been able to provide you high quality care today.  You may receive a patient satisfaction survey over the next few weeks. We would appreciate your feedback and comments so that we may continue to improve ourselves and the health of our patients.

## 2019-11-16 NOTE — Progress Notes (Signed)
I have read the note, and I agree with the clinical assessment and plan.  Naydene Kamrowski A. Din Bookwalter, MD, PhD, FAAN Certified in Neurology, Clinical Neurophysiology, Sleep Medicine, Pain Medicine and Neuroimaging  [facility] [address], [address] [place], [place] [address] [phone]  

## 2020-11-21 ENCOUNTER — Encounter: Payer: Self-pay | Admitting: Adult Health

## 2020-11-21 ENCOUNTER — Ambulatory Visit: Payer: Managed Care, Other (non HMO) | Admitting: Adult Health

## 2020-11-21 VITALS — BP 134/80 | HR 71 | Ht 69.0 in | Wt 205.0 lb

## 2020-11-21 DIAGNOSIS — G40909 Epilepsy, unspecified, not intractable, without status epilepticus: Secondary | ICD-10-CM | POA: Diagnosis not present

## 2020-11-21 MED ORDER — LEVETIRACETAM 500 MG PO TABS: 500.0000 mg | ORAL_TABLET | Freq: Two times a day (BID) | ORAL | 3 refills | Status: DC

## 2020-11-21 NOTE — Progress Notes (Signed)
I agree with the above plan 

## 2020-11-21 NOTE — Progress Notes (Signed)
[facility]  PATIENT: Tanner [name] DOB: 08/25/1981   REASON FOR VISIT: Follow-up for seizure disorder HISTORY FROM: Patient  Chief complaint: Chief Complaint  Patient presents with  . Follow-up    RM 14 with daughter  Pt is well, no seizures, no complaints       HPI:  Tanner [name] is a 40 y.o. male with initial seizure activity in 2018 and was placed on Keppra at that time.  MRI head on 12/2016 showed tiny nonspecific white matter hyperintensities but otherwise no other structural lesion or abnormality.  EEG in 12/2016 normal.     Today, 11/21/2020, returns for yearly seizure follow-up accompanied by his daughter Compliant on Keppra 500 mg twice daily tolerating without side effects No reoccurring seizure activity He continues to work full-time, drives and maintains ADLs and IADLs independently No concerns at this time.       REVIEW OF SYSTEMS: Full 14 system review of systems performed and notable only for those listed, all others are neg:  Constitutional: neg  Cardiovascular: neg Ear/Nose/Throat: neg  Skin: neg Eyes: neg Respiratory: neg Gastroitestinal: neg  Hematology/Lymphatic: neg  Endocrine: neg Musculoskeletal:neg Allergy/Immunology: neg Neurological: neg Psychiatric: neg Sleep : neg   ALLERGIES: No Known Allergies  HOME MEDICATIONS: Outpatient Medications Prior to Visit  Medication Sig Dispense Refill  . levETIRAcetam (KEPPRA) 500 MG tablet Take 1 tablet (500 mg total) by mouth 2 (two) times daily. 180 tablet 3  . gadopentetate dimeglumine (MAGNEVIST) injection 19 mL      No facility-administered medications prior to visit.    PAST MEDICAL HISTORY: Past Medical History:  Diagnosis Date  . Seizures (HCC)     PAST SURGICAL HISTORY: History reviewed. No pertinent surgical history.  FAMILY HISTORY: History reviewed. No pertinent family history.  SOCIAL HISTORY: Social History   Socioeconomic History  .  Marital status: Single    Spouse name: Not on file  . Number of children: Not on file  . Years of education: Not on file  . Highest education level: Not on file  Occupational History  . Not on file  Tobacco Use  . Smoking status: Never Smoker  . Smokeless tobacco: Never Used  Substance and Sexual Activity  . Alcohol use: Yes    Comment: social  . Drug use: No  . Sexual activity: Not on file  Other Topics Concern  . Not on file  Social History Narrative  . Not on file   Social Determinants of Health   Financial Resource Strain: Not on file  Food Insecurity: Not on file  Transportation Needs: Not on file  Physical Activity: Not on file  Stress: Not on file  Social Connections: Not on file  Intimate Partner Violence: Not on file     PHYSICAL EXAM  Vitals:   11/21/20 0729  BP: 134/80  Pulse: 71  Weight: 205 lb (93 kg)  Height: 5\' 9"  (1.753 m)   Body mass index is 30.27 kg/m.  General: well developed, well nourished,  pleasant middle-age male, seated, in no evident distress Head: head normocephalic and atraumatic.   Neck: supple with no carotid or supraclavicular bruits Cardiovascular: regular rate and rhythm, no murmurs Musculoskeletal: no deformity Skin:  no rash/petichiae Vascular:  Normal pulses all extremities   Neurologic Exam Mental Status: Awake and fully alert.   Normal speech and language.  Oriented to place and time. Recent and remote memory intact. Attention span, concentration and fund of knowledge appropriate. Mood and affect appropriate.  Cranial  Nerves: Pupils equal, briskly reactive to light. Extraocular movements full without nystagmus. Visual fields full to confrontation. Hearing intact. Facial sensation intact. Face, tongue, palate moves normally and symmetrically.  Motor: Normal bulk and tone. Normal strength in all tested extremity muscles. Sensory.: intact to touch , pinprick , position and vibratory sensation.  Coordination: Rapid  alternating movements normal in all extremities. Finger-to-nose and heel-to-shin performed accurately bilaterally. Gait and Station: Arises from chair without difficulty. Stance is normal. Gait demonstrates normal stride length and balance Reflexes: 1+ and symmetric. Toes downgoing.       ASSESSMENT AND PLAN    Tanner [name] is a 40 y.o. Hispanic male with history of seizure activity in 2018 consisting of loss of consciousness without reoccurring seizure activity with ongoing use of Keppra 500 mg twice daily.   Seizure disorder Continue Keppra 500 mg twice daily -1 year refill provided Recent lab work obtained by PCP satisfactory Discussed avoidance of seizure provoking triggers or activities Advised to call office with any seizure activity or concerns   Follow-up in 1 year or call earlier if needed   CC:  [facility] provider: Dr. Cristal Ford, Casimiro Needle, MD    I spent 20 minutes of face-to-face and non-face-to-face time with patient.  This included previsit chart review, lab review, study review, order entry, electronic health record documentation, patient education and discussion regarding history of seizures and ongoing use of AED and answered all other questions to patient satisfaction   Ihor Austin, [facility]  [facility] [address] [place], [place] [address]  Phone [phone] Fax [phone] Note: This document was prepared with digital dictation and possible smart phrase technology. Any transcriptional errors that result from this process are unintentional.

## 2022-02-12 NOTE — Progress Notes (Unsigned)
[facility]  PATIENT: Tanner Tanner [name] DOB: 1980/12/05   REASON FOR VISIT: Follow-up for seizure disorder HISTORY FROM: Patient  Chief complaint: No chief complaint on file.     HPI:  Tanner Tanner [name] a 41 y.o. male with initial seizure activity in 2018 and was placed on Keppra at that time.  MRI head on 12/2016 showed tiny nonspecific white matter hyperintensities but otherwise no other structural lesion or abnormality.  EEG in 12/2016 normal.    Update 02/13/2022 JM: Patient returns for yearly seizure follow-up.  Previously seen 11/21/2020 and doing well at that time.  He continues to do well without any seizure activity.  Compliant on Keppra, denies side effects.  Continues to work full-time, drives and maintains ADLs and IADLs independently.  No new concerns at this time.       REVIEW OF SYSTEMS: Full 14 system review of systems performed and notable only for those listed, all others are neg:  Constitutional: neg  Cardiovascular: neg Ear/Nose/Throat: neg  Skin: neg Eyes: neg Respiratory: neg Gastroitestinal: neg  Hematology/Lymphatic: neg  Endocrine: neg Musculoskeletal:neg Allergy/Immunology: neg Neurological: neg Psychiatric: neg Sleep : neg   ALLERGIES: No Known Allergies  HOME MEDICATIONS: Outpatient Medications Prior to Visit  Medication Sig Dispense Refill   levETIRAcetam (KEPPRA) 500 MG tablet Take 1 tablet (500 mg total) by mouth 2 (two) times daily. 180 tablet 3   No facility-administered medications prior to visit.    PAST MEDICAL HISTORY: Past Medical History:  Diagnosis Date   Seizures (HCC)     PAST SURGICAL HISTORY: No past surgical history on file.  FAMILY HISTORY: No family history on file.  SOCIAL HISTORY: Social History   Socioeconomic History   Marital status: Single    Spouse name: Not on file   Number of children: Not on file   Years of education: Not on file   Highest education level: Not on file   Occupational History   Not on file  Tobacco Use   Smoking status: Never   Smokeless tobacco: Never  Substance and Sexual Activity   Alcohol use: Yes    Comment: social   Drug use: No   Sexual activity: Not on file  Other Topics Concern   Not on file  Social History Narrative   Not on file   Social Determinants of Health   Financial Resource Strain: Not on file  Food Insecurity: Not on file  Transportation Needs: Not on file  Physical Activity: Not on file  Stress: Not on file  Social Connections: Not on file  Intimate Partner Violence: Not on file     PHYSICAL EXAM  There were no vitals filed for this visit.  There Tanner [name] no height or weight on file to calculate BMI.  General: well developed, well nourished,  pleasant middle-age male, seated, in no evident distress Head: head normocephalic and atraumatic.   Neck: supple with no carotid or supraclavicular bruits Cardiovascular: regular rate and rhythm, no murmurs Musculoskeletal: no deformity Skin:  no rash/petichiae Vascular:  Normal pulses all extremities   Neurologic Exam Mental Status: Awake and fully alert.   Normal speech and language.  Oriented to place and time. Recent and remote memory intact. Attention span, concentration and fund of knowledge appropriate. Mood and affect appropriate.  Cranial Nerves: Pupils equal, briskly reactive to light. Extraocular movements full without nystagmus. Visual fields full to confrontation. Hearing intact. Facial sensation intact. Face, tongue, palate moves normally and symmetrically.  Motor: Normal bulk and tone.  Normal strength in all tested extremity muscles. Sensory.: intact to touch , pinprick , position and vibratory sensation.  Coordination: Rapid alternating movements normal in all extremities. Finger-to-nose and heel-to-shin performed accurately bilaterally. Gait and Station: Arises from chair without difficulty. Stance Tanner [name] normal. Gait demonstrates normal stride length  and balance Reflexes: 1+ and symmetric. Toes downgoing.       ASSESSMENT AND PLAN    Tanner Tanner [name] Tanner [name] a 41 y.o. Hispanic male with history of seizure activity in 2018 consisting of loss of consciousness without reoccurring seizure activity with ongoing use of Keppra 500 mg twice daily.     Seizure disorder Continue Keppra 500 mg twice daily -1 year refill provided Recent lab work obtained by PCP satisfactory Discussed avoidance of seizure provoking triggers or activities Advised to call office with any seizure activity or concerns    Follow-up in 1 year or call earlier if needed    CC:  Loyal Jacobson, MD    I spent 20 minutes of face-to-face and non-face-to-face time with patient.  This included previsit chart review, lab review, study review, order entry, electronic health record documentation, patient education and discussion regarding history of seizures and ongoing use of AED and answered all other questions to patient satisfaction   Ihor Austin, [facility]  [facility] [address] [place], [place] [address]  Phone [phone] Fax [phone] Note: This document was prepared with digital dictation and possible smart phrase technology. Any transcriptional errors that result from this process are unintentional.

## 2022-02-13 ENCOUNTER — Encounter: Payer: Self-pay | Admitting: Adult Health

## 2022-02-13 ENCOUNTER — Ambulatory Visit: Payer: Managed Care, Other (non HMO) | Admitting: Adult Health

## 2022-02-13 VITALS — BP 139/81 | HR 62 | Ht 68.0 in | Wt 198.0 lb

## 2022-02-13 DIAGNOSIS — G40909 Epilepsy, unspecified, not intractable, without status epilepticus: Secondary | ICD-10-CM | POA: Diagnosis not present

## 2022-02-13 MED ORDER — LEVETIRACETAM 500 MG PO TABS: 500.0000 mg | ORAL_TABLET | Freq: Two times a day (BID) | ORAL | 3 refills | Status: DC

## 2022-02-13 NOTE — Patient Instructions (Signed)
No changes today - continue current plan  Follow up in 1 year or call earlier if needed 

## 2022-05-29 ENCOUNTER — Encounter: Payer: Self-pay | Admitting: Adult Health

## 2022-07-06 ENCOUNTER — Encounter: Payer: Self-pay | Admitting: Adult Health

## 2022-07-10 ENCOUNTER — Other Ambulatory Visit: Payer: Self-pay | Admitting: Neurology

## 2022-07-10 MED ORDER — LEVETIRACETAM ER 500 MG PO TB24: 1000.0000 mg | ORAL_TABLET | Freq: Every day | ORAL | 5 refills | Status: DC

## 2022-07-10 MED ORDER — LEVETIRACETAM ER 1000 MG PO TB24: 1000.0000 mg | ORAL_TABLET | Freq: Every day | ORAL | 5 refills | Status: DC

## 2022-07-10 NOTE — Telephone Encounter (Signed)
What's your optinion? Do you think keppra is causing the emotional affects he is having ?

## 2022-07-10 NOTE — Telephone Encounter (Signed)
These symptoms could be from keppra. Will recommend transitioning to XR formulation as this could help with these potential side effects. Will place new order for Keppra XR 1000mg  nightly.

## 2022-12-24 ENCOUNTER — Other Ambulatory Visit: Payer: Self-pay | Admitting: Adult Health

## 2023-02-18 ENCOUNTER — Ambulatory Visit: Payer: Managed Care, Other (non HMO) | Admitting: Adult Health

## 2023-02-21 ENCOUNTER — Other Ambulatory Visit: Payer: Self-pay | Admitting: Adult Health

## 2023-03-03 NOTE — Progress Notes (Unsigned)
[facility]  PATIENT: Tanner [name] DOB: [birth year]   REASON FOR VISIT: Follow-up for seizure disorder HISTORY FROM: Patient  Chief complaint: No chief complaint on file.     HPI:  Tanner [name] is a 42 y.o. male with initial seizure activity in 2018 and was placed on Keppra at that time.  MRI head on 12/2016 showed tiny nonspecific white matter hyperintensities but otherwise no other structural lesion or abnormality.  EEG in 12/2016 normal.    Update 03/03/2023 JM: returns for yearly follow up visit. Previously seen 02/13/2022 and doing well at that time. In 06/2022, pt c/o potential depression/mood change side effects from keppra IR 500mg  BID therefore switched to keppra XR 1000mg  daily, has been doing since that time. Compliant on keppra dosage. Denies any seizure activity.  Continues to work full time, drives and maintains ADLs and IADLs independently No new neurological concerns at this time.   Update 02/13/2022 JM: Patient returns for yearly seizure follow-up unaccompanied.  Previously seen 11/21/2020 and doing well at that time.  He continues to do well without any seizure activity.  Compliant on Keppra, denies side effects.  Continues to work full-time, drives and maintains ADLs and IADLs independently.  No new neurological concerns at this time.       REVIEW OF SYSTEMS: Full 14 system review of systems performed and notable only for those listed, all others are neg:  Constitutional: neg  Cardiovascular: neg Ear/Nose/Throat: neg  Skin: neg Eyes: neg Respiratory: neg Gastroitestinal: neg  Hematology/Lymphatic: neg  Endocrine: neg Musculoskeletal:neg Allergy/Immunology: neg Neurological: neg Psychiatric: neg Sleep : neg   ALLERGIES: No Known Allergies  HOME MEDICATIONS: Outpatient Medications Prior to Visit  Medication Sig Dispense Refill   levETIRAcetam (KEPPRA XR) 500 MG 24 hr tablet TAKE 2 TABLETS BY MOUTH AT BEDTIME 60 tablet 0   No  facility-administered medications prior to visit.    PAST MEDICAL HISTORY: Past Medical History:  Diagnosis Date   Seizures (HCC)     PAST SURGICAL HISTORY: No past surgical history on file.  FAMILY HISTORY: No family history on file.  SOCIAL HISTORY: Social History   Socioeconomic History   Marital status: Single    Spouse name: Not on file   Number of children: Not on file   Years of education: Not on file   Highest education level: Not on file  Occupational History   Not on file  Tobacco Use   Smoking status: Never   Smokeless tobacco: Never  Substance and Sexual Activity   Alcohol use: Yes    Comment: social   Drug use: No   Sexual activity: Not on file  Other Topics Concern   Not on file  Social History Narrative   Not on file   Social Determinants of Health   Financial Resource Strain: Not on file  Food Insecurity: Not on file  Transportation Needs: Not on file  Physical Activity: Not on file  Stress: Not on file  Social Connections: Not on file  Intimate Partner Violence: Not on file     PHYSICAL EXAM  There were no vitals filed for this visit.  There is no height or weight on file to calculate BMI.   General: well developed, well nourished,  pleasant middle-age male, seated, in no evident distress Head: head normocephalic and atraumatic.   Neck: supple with no carotid or supraclavicular bruits Cardiovascular: regular rate and rhythm, no murmurs Musculoskeletal: no deformity Skin:  no rash/petichiae Vascular:  Normal pulses all extremities  Neurologic Exam Mental Status: Awake and fully alert.   Normal speech and language.  Oriented to place and time. Recent and remote memory intact. Attention span, concentration and fund of knowledge appropriate. Mood and affect appropriate.  Cranial Nerves: Pupils equal, briskly reactive to light. Extraocular movements full without nystagmus. Visual fields full to confrontation. Hearing intact. Facial  sensation intact. Face, tongue, palate moves normally and symmetrically.  Motor: Normal bulk and tone. Normal strength in all tested extremity muscles. Sensory.: intact to touch , pinprick , position and vibratory sensation.  Coordination: Rapid alternating movements normal in all extremities. Finger-to-nose and heel-to-shin performed accurately bilaterally. Gait and Station: Arises from chair without difficulty. Stance is normal. Gait demonstrates normal stride length and balance without use of assistive device.  Tandem walk and heel toe without difficulty. Reflexes: 1+ and symmetric. Toes downgoing.       ASSESSMENT AND PLAN    Tanner [name] is a 42 y.o. Hispanic male with history of seizure activity in 2018 consisting of loss of consciousness without reoccurring seizure activity with ongoing use of Keppra.      Seizure disorder Continue Keppra XR 1000mg  nightly -1 year refill provided CMP and CBC routinely monitored by PCP - has been satisfactory per patient (unable to view via epic) Discussed avoidance of seizure provoking triggers or activities Advised to call office with any seizure activity or concerns    Follow-up in 1 year or call earlier if needed    CC:  Loyal Jacobson, MD    I spent 21 minutes of face-to-face and non-face-to-face time with patient.  This included previsit chart review, lab review, study review, order entry, electronic health record documentation, patient education and discussion regarding history of seizures and ongoing use of AED and answered all other questions to patient satisfaction   Ihor Austin, [facility]  [facility] [address] [place], [place] [address]  Phone [phone] Fax [phone] Note: This document was prepared with digital dictation and possible smart phrase technology. Any transcriptional errors that result from this process are unintentional.

## 2023-03-04 ENCOUNTER — Encounter: Payer: Self-pay | Admitting: Adult Health

## 2023-03-04 ENCOUNTER — Ambulatory Visit (INDEPENDENT_AMBULATORY_CARE_PROVIDER_SITE_OTHER): Payer: No Typology Code available for payment source | Admitting: Adult Health

## 2023-03-04 VITALS — BP 138/86 | Ht 69.0 in | Wt 214.0 lb

## 2023-03-04 DIAGNOSIS — Z5181 Encounter for therapeutic drug level monitoring: Secondary | ICD-10-CM

## 2023-03-04 DIAGNOSIS — G40909 Epilepsy, unspecified, not intractable, without status epilepticus: Secondary | ICD-10-CM | POA: Diagnosis not present

## 2023-03-04 NOTE — Patient Instructions (Addendum)
Continue keppra XR 1000mg  nightly for seizure prevention  Will check lab work today - if satisfactory, will plan on transitioning to lamotrigine. I will send you a message in MyChart regarding gradual increase of lamotrigine dosage and once at full dose, will gradually taper off keppra  Please call with any seizure activity or medication concerns    Follow-up in 1 year or call earlier if needed

## 2023-03-05 ENCOUNTER — Other Ambulatory Visit: Payer: Self-pay | Admitting: Adult Health

## 2023-03-05 ENCOUNTER — Encounter: Payer: Self-pay | Admitting: Adult Health

## 2023-03-05 LAB — CBC
Hematocrit: 46.9 % (ref 37.5–51.0)
Hemoglobin: 15.6 g/dL (ref 13.0–17.7)
MCH: 30.3 pg (ref 26.6–33.0)
MCHC: 33.3 g/dL (ref 31.5–35.7)
MCV: 91 fL (ref 79–97)
Platelets: 183 10*3/uL (ref 150–450)
RBC: 5.15 x10E6/uL (ref 4.14–5.80)
RDW: 12.6 % (ref 11.6–15.4)
WBC: 5.6 10*3/uL (ref 3.4–10.8)

## 2023-03-05 LAB — COMPREHENSIVE METABOLIC PANEL
ALT: 31 IU/L (ref 0–44)
AST: 16 IU/L (ref 0–40)
Albumin: 4.5 g/dL (ref 4.1–5.1)
Alkaline Phosphatase: 107 IU/L (ref 44–121)
BUN/Creatinine Ratio: 11 (ref 9–20)
BUN: 11 mg/dL (ref 6–24)
Bilirubin Total: 0.3 mg/dL (ref 0.0–1.2)
CO2: 25 mmol/L (ref 20–29)
Calcium: 9.5 mg/dL (ref 8.7–10.2)
Chloride: 104 mmol/L (ref 96–106)
Creatinine, Ser: 0.96 mg/dL (ref 0.76–1.27)
Globulin, Total: 2.7 g/dL (ref 1.5–4.5)
Glucose: 139 mg/dL — ABNORMAL HIGH (ref 70–99)
Potassium: 4.3 mmol/L (ref 3.5–5.2)
Sodium: 141 mmol/L (ref 134–144)
Total Protein: 7.2 g/dL (ref 6.0–8.5)
eGFR: 101 mL/min/{1.73_m2} (ref >59)

## 2023-03-05 MED ORDER — LAMOTRIGINE 25 MG PO TABS: ORAL_TABLET | ORAL | 3 refills | Status: DC

## 2023-03-27 ENCOUNTER — Other Ambulatory Visit: Payer: Self-pay | Admitting: Adult Health

## 2023-05-14 ENCOUNTER — Encounter: Payer: Self-pay | Admitting: Adult Health

## 2023-05-14 ENCOUNTER — Other Ambulatory Visit: Payer: Self-pay | Admitting: Neurology

## 2023-05-14 MED ORDER — LAMOTRIGINE 100 MG PO TABS: 100.0000 mg | ORAL_TABLET | Freq: Two times a day (BID) | ORAL | 1 refills | Status: DC

## 2023-11-04 ENCOUNTER — Other Ambulatory Visit: Payer: Self-pay | Admitting: Adult Health

## 2023-11-04 NOTE — Telephone Encounter (Signed)
 Last seen on 03/04/23 Follow up scheduled on 03/09/24

## 2024-02-04 ENCOUNTER — Other Ambulatory Visit: Payer: Self-pay | Admitting: Adult Health

## 2024-03-06 NOTE — Progress Notes (Unsigned)
 [facility]  PATIENT: Tanner [name] DOB: 16-Jun-1981   REASON FOR VISIT: Follow-up for seizure disorder HISTORY FROM: Patient  Chief complaint: No chief complaint on file.   Follow-up visit:  Prior visit: 03/04/2023  Brief HPI:  Tanner [name] is a 43 y.o. male with initial seizure activity in 2018 and was placed on Keppra  at that time.  MRI head on 12/2016 showed tiny nonspecific white matter hyperintensities but otherwise no other structural lesion or abnormality.  EEG in 12/2016 normal.  No recurrent seizures on Keppra  but complained of mood changes therefore transition to XR formulation in 06/2022.  At prior visit, no additional seizures on Keppra  XR but continued to have mood side effects therefore gradually transitioned to lamotrigine    Interval history:      Update 03/03/2023 JM: returns for yearly follow up visit unaccompanied. Previously seen 02/13/2022 and doing well at that time. In 06/2022, pt c/o potential depression/mood change side effects from keppra  IR 500mg  BID therefore switched to keppra  XR 1000mg  daily, reports only some improvement of mood. Denies any seizure activity.  He questions coming off keppra  or switching to a different medication. He has previously attempted to stopped keppra  in the past but had recurrent seizure about 4-5 months after stopping. He has not previously tried any other antiseizure medications.  He has not had follow-up with PCP since 2022, lab work completed at that time.  Continues to work full time, drives and maintains ADLs and IADLs independently No new neurological concerns at this time.      REVIEW OF SYSTEMS: Full 14 system review of systems performed and notable only for those listed, all others are neg:  Constitutional: neg  Cardiovascular: neg Ear/Nose/Throat: neg  Skin: neg Eyes: neg Respiratory: neg Gastroitestinal: neg  Hematology/Lymphatic: neg  Endocrine:  neg Musculoskeletal:neg Allergy/Immunology: neg Neurological: neg Psychiatric: neg Sleep : neg   ALLERGIES: No Known Allergies  HOME MEDICATIONS: Outpatient Medications Prior to Visit  Medication Sig Dispense Refill   lamoTRIgine  (LAMICTAL ) 100 MG tablet Take 1 tablet by mouth twice daily 180 tablet 1   levETIRAcetam  (KEPPRA  XR) 500 MG 24 hr tablet TAKE 2 TABLETS BY MOUTH AT BEDTIME 60 tablet 0   meloxicam (MOBIC) 7.5 MG tablet Take 7.5 mg by mouth daily.     No facility-administered medications prior to visit.    PAST MEDICAL HISTORY: Past Medical History:  Diagnosis Date   Seizures (HCC)     PAST SURGICAL HISTORY: No past surgical history on file.  FAMILY HISTORY: No family history on file.  SOCIAL HISTORY: Social History   Socioeconomic History   Marital status: Single    Spouse name: Not on file   Number of children: Not on file   Years of education: Not on file   Highest education level: Not on file  Occupational History   Not on file  Tobacco Use   Smoking status: Never   Smokeless tobacco: Never  Substance and Sexual Activity   Alcohol use: Yes    Comment: social   Drug use: No   Sexual activity: Not on file  Other Topics Concern   Not on file  Social History Narrative   Not on file   Social Drivers of Health   Financial Resource Strain: Not on file  Food Insecurity: Low Risk  (02/25/2024)   Received from [facility]   Hunger Vital Sign    Within the past 12 months, you worried that your food would run out before you got  money to buy more: Never true    Within the past 12 months, the food you bought just didn't last and you didn't have money to get more. : Never true  Transportation Needs: No Transportation Needs (02/25/2024)   Received from [facility]    In the past 12 months, has lack of reliable transportation kept you from medical appointments, meetings, work or from getting things needed for daily living? : No   Physical Activity: Not on file  Stress: Not on file  Social Connections: Not on file  Intimate Partner Violence: Not on file     PHYSICAL EXAM  There were no vitals filed for this visit.  There is no height or weight on file to calculate BMI.  General: well developed, well nourished,  pleasant middle-age male, seated, in no evident distress Head: head normocephalic and atraumatic.   Neck: supple with no carotid or supraclavicular bruits Cardiovascular: regular rate and rhythm, no murmurs Musculoskeletal: no deformity Skin:  no rash/petichiae Vascular:  Normal pulses all extremities   Neurologic Exam Mental Status: Awake and fully alert.   Normal speech and language.  Oriented to place and time. Recent and remote memory intact. Attention span, concentration and fund of knowledge appropriate. Mood and affect appropriate.  Cranial Nerves: Pupils equal, briskly reactive to light. Extraocular movements full without nystagmus. Visual fields full to confrontation. Hearing intact. Facial sensation intact. Face, tongue, palate moves normally and symmetrically.  Motor: Normal bulk and tone. Normal strength in all tested extremity muscles. Sensory.: intact to touch , pinprick , position and vibratory sensation.  Coordination: Rapid alternating movements normal in all extremities. Finger-to-nose and heel-to-shin performed accurately bilaterally. Gait and Station: Arises from chair without difficulty. Stance is normal. Gait demonstrates normal stride length and balance without use of assistive device.  Tandem walk and heel toe without difficulty. Reflexes: 1+ and symmetric. Toes downgoing.       ASSESSMENT AND PLAN    Tanner [name] is a 43 y.o. Hispanic male with history of seizure activity in 2018 consisting of loss of consciousness without reoccurring seizure activity with ongoing use of Keppra .  Difficulty tolerating Keppra  due to mood change side effects.  Previously attempted to  stop Keppra  but had seizure about 4-5 months later, no recurrent seizure since restarting although due to persistent mood change side effects, he was transition to lamotrigine  in 06/2023.      Seizure disorder Continue lamotrigine  100 mg twice daily for seizure prevention Prior intolerance to Keppra  IR and XR d/t mood side effects Will check lamotrigine  level today, recent CBC and CMP normal Discussed avoidance of seizure provoking triggers or activities Advised to call office with any seizure activity or concerns    Follow-up in 1 year or call earlier if needed    CC:  Millicent Sharper, MD    I personally spent a total of *** minutes in the care of the patient today including {Time Based Coding:210964241}.    Harlene Bogaert, AGNP-BC  [facility] [address] [place], [place] [address]  Phone [phone] Fax [phone] Note: This document was prepared with digital dictation and possible smart phrase technology. Any transcriptional errors that result from this process are unintentional.

## 2024-03-09 ENCOUNTER — Encounter: Payer: Self-pay | Admitting: Adult Health

## 2024-03-09 ENCOUNTER — Ambulatory Visit (INDEPENDENT_AMBULATORY_CARE_PROVIDER_SITE_OTHER): Payer: No Typology Code available for payment source | Admitting: Adult Health

## 2024-03-09 VITALS — BP 143/96 | HR 87 | Ht 69.0 in | Wt 209.0 lb

## 2024-03-09 DIAGNOSIS — G40909 Epilepsy, unspecified, not intractable, without status epilepticus: Secondary | ICD-10-CM | POA: Diagnosis not present

## 2024-03-09 DIAGNOSIS — Z5181 Encounter for therapeutic drug level monitoring: Secondary | ICD-10-CM

## 2024-03-09 MED ORDER — LAMOTRIGINE 100 MG PO TABS: 100.0000 mg | ORAL_TABLET | Freq: Two times a day (BID) | ORAL | 3 refills | Status: AC

## 2024-03-09 NOTE — Patient Instructions (Signed)
 Your Plan:  Continue Lamictal  100 mg twice daily for seizure prevention  We will check Lamictal  level today and you will be notified via MyChart with results  Please call with any seizure activity     Follow-up in 1 year or call earlier if needed     Thank you for coming to see us  at [facility]. I hope we have been able to provide you high quality care today.  You may receive a patient satisfaction survey over the next few weeks. We would appreciate your feedback and comments so that we may continue to improve ourselves and the health of our patients.

## 2024-03-10 LAB — LAMOTRIGINE LEVEL: Lamotrigine Lvl: 2.8 ug/mL (ref 2.0–20.0)

## 2024-03-11 ENCOUNTER — Ambulatory Visit: Payer: Self-pay | Admitting: Adult Health

## 2025-03-09 ENCOUNTER — Ambulatory Visit: Admitting: Adult Health
